# Patient Record
Sex: Female | Born: 1975 | Race: White | Hispanic: No | Marital: Married | Smoking: Never smoker
Health system: Southern US, Community
[De-identification: ages and names within clinical notes are randomized; demographics above are authoritative.]

## PROBLEM LIST (undated history)

## (undated) DIAGNOSIS — R7303 Prediabetes: Secondary | ICD-10-CM

## (undated) DIAGNOSIS — F32A Depression, unspecified: Secondary | ICD-10-CM

## (undated) DIAGNOSIS — D171 Benign lipomatous neoplasm of skin and subcutaneous tissue of trunk: Secondary | ICD-10-CM

## (undated) DIAGNOSIS — F329 Major depressive disorder, single episode, unspecified: Secondary | ICD-10-CM

## (undated) HISTORY — DX: Major depressive disorder, single episode, unspecified: F32.9

## (undated) HISTORY — DX: Benign lipomatous neoplasm of skin and subcutaneous tissue of trunk: D17.1

## (undated) HISTORY — DX: Depression, unspecified: F32.A

---

## 2001-06-05 ENCOUNTER — Other Ambulatory Visit: Admission: RE | Admit: 2001-06-05 | Discharge: 2001-06-05 | Payer: Self-pay | Admitting: *Deleted

## 2003-05-01 ENCOUNTER — Ambulatory Visit (HOSPITAL_COMMUNITY): Admission: RE | Admit: 2003-05-01 | Discharge: 2003-05-01 | Payer: Self-pay | Admitting: *Deleted

## 2003-05-01 ENCOUNTER — Encounter: Payer: Self-pay | Admitting: *Deleted

## 2003-09-21 ENCOUNTER — Inpatient Hospital Stay (HOSPITAL_COMMUNITY): Admission: AD | Admit: 2003-09-21 | Discharge: 2003-09-22 | Payer: Self-pay | Admitting: *Deleted

## 2005-10-13 ENCOUNTER — Emergency Department (HOSPITAL_COMMUNITY): Admission: EM | Admit: 2005-10-13 | Discharge: 2005-10-13 | Payer: Self-pay | Admitting: Emergency Medicine

## 2010-01-10 ENCOUNTER — Ambulatory Visit (HOSPITAL_COMMUNITY): Admission: RE | Admit: 2010-01-10 | Discharge: 2010-01-10 | Payer: Self-pay | Admitting: Family Medicine

## 2011-03-10 NOTE — H&P (Signed)
NAME:  Jane Guzman, Jane Guzman                     ACCOUNT NO.:  [id number]   MEDICAL RECORD NO.:  [id number]                   PATIENT TYPE:  INP   LOCATION:  LDR1                                 FACILITY:  [facility]   PHYSICIAN:  Lazaro Arms, M.D.                DATE OF BIRTH:  1976/03/17   DATE OF ADMISSION:  09/21/2003  DATE OF DISCHARGE:                                HISTORY & PHYSICAL   HISTORY OF PRESENT ILLNESS:  The patient is a 35 year old, gravida 3, para  2, abortus 0, with estimated date of delivery of October 08, 2003, by last  menstrual period and a second trimester sonogram.  The patient has been  followed in prenatal care by Dr. Lisette Grinder and has had full in law prenatal  care since the first trimester.  The patient presented to labor and delivery  complaining of regular uterine contractions.  She was 4 cm, 100% effaced,  with bulging bag of water and having regular contractions.  The patient did  not want to have an epidural with this delivery.   PAST MEDICAL HISTORY:  Negative.   PAST SURGICAL HISTORY:  Negative.   PAST OBSTETRICAL HISTORY:  Two vaginal deliveries.   ALLERGIES:  PENICILLIN.   MEDICATIONS:  Prenatal vitamins.   REVIEW OF SYSTEMS:  Otherwise negative.   PRENATAL LABORATORY DATA:  Blood type is O positive.  Antibody is negative.  HIV is nonreactive.  Hepatitis B was negative.  Rubella was negative.  Glucola was normal with last pregnancy.  Her AFP test was abnormal.  She had  an elevated Down's syndrome risk, and her ultrasound was  within normal  limits.  She declined an amniocentesis.  GC and Chlamydia were normal.  Drug  screen was negative.  Group B strep was negative.   PHYSICAL EXAMINATION:  Per prenatal chart, normal.   IMPRESSION:  1. Intrauterine pregnancy at 37-1/[redacted] weeks gestation.  2. Active labor.   PLAN:  The patient is admitted for expectant management.     ___________________________________________              Lazaro Arms, M.D.   LHE/MEDQ  D:  09/21/2003  T:  09/21/2003  Job:  [id number]

## 2011-03-10 NOTE — Discharge Summary (Signed)
NAME:  Jane Guzman, Jane Guzman                     ACCOUNT NO.:  [id number]   MEDICAL RECORD NO.:  [id number]                   PATIENT TYPE:  INP   LOCATION:  A419                                 FACILITY:  [facility]   PHYSICIAN:  Langley Gauss, M.D.                DATE OF BIRTH:  1976/10/13   DATE OF ADMISSION:  09/21/2003  DATE OF DISCHARGE:  09/22/2003                                 DISCHARGE SUMMARY   DIAGNOSIS:  Term pregnancy presenting in labor.   The patient admitted by Dr. Duane Lope on September 21, 2003.  Labor managed  by Dr. Duane Lope and delivery performed by Dr. Duane Lope.  Dictations  for H&P and delivery per Dr. Despina Hidden not available in the chart at time of  discharge by Dr. Lisette Grinder on September 22, 2003.   PROCEDURES:  Vaginal delivery, delivered over an intact perineum.  The  patient received IV analgesics only during the course of labor.   At time of discharge the patient is bottle feeding.  She would like to  utilize oral contraceptives for birth control purposes.  She is given a  prescription for Desogen to start in two weeks time.  Discharge medications  include Tylox for pain relief.   PERTINENT LABORATORY STUDIES:  Hemoglobin and hematocrit 12.9/38.2 with a  white count of 18.2.   HOSPITAL COURSE:  The patient admitted on September 21, 2003 in active labor.  The patient progressed very rapidly to complete dilatation.  Delivery  performed by Dr. Duane Lope according to cross-coverage arrangement.  Subsequently, I saw the patient on rounds on September 21, 2003 and the  patient is discharged to home on September 22, 2003.     ___________________________________________                                         Langley Gauss, M.D.   DC/MEDQ  D:  09/22/2003  T:  09/22/2003  Job:  [id number]

## 2011-03-10 NOTE — Op Note (Signed)
NAME:  CINTIA, GLEED                     ACCOUNT NO.:  [id number]   MEDICAL RECORD NO.:  [id number]                   PATIENT TYPE:  INP   LOCATION:  LDR1                                 FACILITY:  [facility]   PHYSICIAN:  Lazaro Arms, M.D.                DATE OF BIRTH:  [birth year]   DATE OF PROCEDURE:  09/21/2003  DATE OF DISCHARGE:                                 OPERATIVE REPORT   DELIVERY NOTE:   OBSTETRICIAN:  Lazaro Arms, M.D.   DESCRIPTION OF PROCEDURE:  Jane Guzman is a 35 year old white female, gravida  3, para 2, at 37-1/[redacted] weeks gestation.  Progressed well through active phase  of labor.  The patient was found complete pushing.  With two maternal  pushing efforts, she delivered a viable female infant at 4 with Apgars  9/9, weighing 6 pounds and 4 ounces over an intact perineum.  There was a  three-vessel cord.  Cord blood and cord gas were sent.  Placenta was  delivered spontaneously and was normal and intact.  The uterus went down  nicely, and she had appropriate postpartum bleeding, total of about 200 ml  for delivery.   The patient tolerated delivery well.  She will undergo routine postpartum  care.  She is O positive and immune rubella.  She is planning to bottle  feed.      ___________________________________________                                            Lazaro Arms, M.D.   LHE/MEDQ  D:  09/21/2003  T:  09/21/2003  Job:  [id number]

## 2011-10-24 DIAGNOSIS — R7303 Prediabetes: Secondary | ICD-10-CM

## 2011-10-24 HISTORY — DX: Prediabetes: R73.03

## 2012-06-11 ENCOUNTER — Other Ambulatory Visit (HOSPITAL_COMMUNITY): Payer: Self-pay | Admitting: Family Medicine

## 2012-06-11 DIAGNOSIS — R1012 Left upper quadrant pain: Secondary | ICD-10-CM

## 2012-06-11 DIAGNOSIS — R109 Unspecified abdominal pain: Secondary | ICD-10-CM

## 2012-06-12 ENCOUNTER — Encounter (HOSPITAL_COMMUNITY): Payer: Self-pay

## 2012-06-12 ENCOUNTER — Ambulatory Visit (HOSPITAL_COMMUNITY)
Admission: RE | Admit: 2012-06-12 | Discharge: 2012-06-12 | Disposition: A | Discharge status: Home / Self Care | Payer: 59 | Source: Ambulatory Visit | Attending: Family Medicine | Admitting: Family Medicine

## 2012-06-12 DIAGNOSIS — R599 Enlarged lymph nodes, unspecified: Secondary | ICD-10-CM | POA: Insufficient documentation

## 2012-06-12 DIAGNOSIS — R1012 Left upper quadrant pain: Secondary | ICD-10-CM | POA: Insufficient documentation

## 2012-06-12 DIAGNOSIS — R109 Unspecified abdominal pain: Secondary | ICD-10-CM

## 2012-06-12 HISTORY — DX: Prediabetes: R73.03

## 2012-06-12 MED ORDER — IOHEXOL 300 MG/ML  SOLN
100.0000 mL | Freq: Once | INTRAMUSCULAR | Status: AC | PRN
  Administered 2012-06-12: 100 mL via INTRAVENOUS

## 2012-06-19 ENCOUNTER — Encounter (INDEPENDENT_AMBULATORY_CARE_PROVIDER_SITE_OTHER): Payer: Self-pay | Admitting: *Deleted

## 2012-07-08 ENCOUNTER — Encounter (INDEPENDENT_AMBULATORY_CARE_PROVIDER_SITE_OTHER): Payer: Self-pay | Admitting: Internal Medicine

## 2012-07-08 ENCOUNTER — Ambulatory Visit (INDEPENDENT_AMBULATORY_CARE_PROVIDER_SITE_OTHER): Payer: 59 | Admitting: Internal Medicine

## 2012-07-08 VITALS — BP 150/84 | HR 84 | Temp 98.3°F | Ht 68.0 in | Wt 246.5 lb

## 2012-07-08 DIAGNOSIS — D179 Benign lipomatous neoplasm, unspecified: Secondary | ICD-10-CM | POA: Insufficient documentation

## 2012-07-08 MED ORDER — HYOSCYAMINE SULFATE 0.125 MG SL SUBL: 0.1250 mg | SUBLINGUAL_TABLET | SUBLINGUAL | Status: DC | PRN

## 2012-07-08 NOTE — Patient Instructions (Addendum)
OV in 2 months. Levsin 0.125mg  sl.

## 2012-07-08 NOTE — Progress Notes (Signed)
Subjective:     Patient ID: Jane Guzman, female   DOB: 1976-03-18, 36 y.o.   MRN: [id number]  HPIReferred by [facility] with c/o left mid lateral pain.  She tells me she can feel a knot on her side. She underwent a CT (see below). She denies rt upper quadrant pain. Appetite is good. Weight loss of 3 pounds intentional. She has a BM daily. No melena or bright red rectal bleeding.  06/08/2012 NA 138, K 4.6, Chloride 104, Glucose 106, BUN 16, Creatinine 0.90, ALP 94, AST 14, ALT 16, Albumin 4.3, WBC 8.2, H and  H 13.4 and 38.8, Platelet ct 402.  CT abdomen/pelvis with CM 06/11/2012: 1. No findings to explain the patient's left upper quadrant pain.  2. Borderline enlarged porta hepatis lymph node may relate to  hepatic steatosis.   Review of Systems see hpi Current Outpatient Prescriptions  Medication Sig Dispense Refill  . metFORMIN (GLUCOPHAGE) 500 MG tablet Take 500 mg by mouth daily with breakfast.       Past Medical History  Diagnosis Date  . Pre-diabetes 2013   Past Medical History  Diagnosis Date  . Pre-diabetes 2013   History reviewed. No pertinent past surgical history. History   Social History  . Marital Status: Married    Spouse Name: N/A    Number of Children: N/A  . Years of Education: N/A   Occupational History  . Not on file.   Social History Main Topics  . Smoking status: Never Smoker   . Smokeless tobacco: Not on file  . Alcohol Use: No  . Drug Use: No  . Sexually Active: Not on file   Other Topics Concern  . Not on file   Social History Narrative  . No narrative on file   Allergies  Allergen Reactions  . Penicillins          Objective:   Physical Exam Filed Vitals:   07/08/12 1534  BP: 150/84  Pulse: 84  Temp: 98.3 F (36.8 C)  Height: 5\' 8"  (1.727 m)  Weight: 246 lb 8 oz (111.812 kg)   Alert and oriented. Skin warm and dry. Oral mucosa is moist.   . Sclera anicteric, conjunctivae is pink. Thyroid not enlarged. No cervical  lymphadenopathy. Lungs clear. Heart regular rate and rhythm.  Abdomen is soft. Bowel sounds are positive. No hepatomegaly. ?Fatty tumor left mid abdomen. Dr. Karilyn Cota in and he also felt this area.  No tenderness.  No edema to lower extremities. Patient is alert and oriented.     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 Dr. Karilyn Cota in room during exam. Agree that this is probably a fatty tumor.     Plan:    Levsin 0.125mg  sl. # 60. OV in 2 months.

## 2012-08-02 ENCOUNTER — Encounter (INDEPENDENT_AMBULATORY_CARE_PROVIDER_SITE_OTHER): Payer: Self-pay

## 2012-09-09 ENCOUNTER — Ambulatory Visit (INDEPENDENT_AMBULATORY_CARE_PROVIDER_SITE_OTHER): Payer: 59 | Admitting: Internal Medicine

## 2012-09-17 ENCOUNTER — Encounter (INDEPENDENT_AMBULATORY_CARE_PROVIDER_SITE_OTHER): Payer: Self-pay | Admitting: Internal Medicine

## 2012-09-17 ENCOUNTER — Ambulatory Visit (INDEPENDENT_AMBULATORY_CARE_PROVIDER_SITE_OTHER): Payer: 59 | Admitting: Internal Medicine

## 2012-09-17 VITALS — BP 140/70 | HR 60 | Temp 97.8°F | Ht 68.0 in | Wt 247.7 lb

## 2012-09-17 DIAGNOSIS — D179 Benign lipomatous neoplasm, unspecified: Secondary | ICD-10-CM

## 2012-09-17 DIAGNOSIS — E119 Type 2 diabetes mellitus without complications: Secondary | ICD-10-CM | POA: Insufficient documentation

## 2012-09-17 NOTE — Progress Notes (Signed)
Subjective:     Patient ID: Jane Guzman, female   DOB: [birth year], 36 y.o.   MRN: [id number]  HPIHere today for f/u. She states she feels better.  She occasionally has left flank pain. She will take a Levsin and the pain is relieved. There is no rt upper quadrant pain. Her heart burn is better. She may have acid reflux 3-4 times a week. She eats what she wants. She has a BM about every 3-4 days since starting the Levsin. She takes the Levsin about twice a week. She feels 75% better.    06/08/2012 NA 138, K 4.6, Chloride 104, Glucose 106, BUN 16, Creatinine 0.90, ALP 94, AST 14, ALT 16, Albumin 4.3, WBC 8.2, H and H 13.4 and 38.8, Platelet ct 402.  CT abdomen/pelvis with CM 06/11/2012: 1. No findings to explain the patient's left upper quadrant pain.  2. Borderline enlarged porta hepatis lymph node may relate to hepatic steatosis.   Review of Systems See hpi Current Outpatient Prescriptions  Medication Sig Dispense Refill  . hyoscyamine (LEVSIN/SL) 0.125 MG SL tablet Place 1 tablet (0.125 mg total) under the tongue every 4 (four) hours as needed for cramping.  60 tablet  0  . metFORMIN (GLUCOPHAGE) 500 MG tablet Take 500 mg by mouth daily with breakfast.       Past Medical History  Diagnosis Date  . Pre-diabetes 2013   History reviewed. No pertinent past surgical history. Allergies  Allergen Reactions  . Penicillins        Objective:   Physical Exam Filed Vitals:   09/17/12 1543  BP: 140/70  Pulse: 60  Temp: 97.8 F (36.6 C)  Height: 5\' 8"  (1.727 m)  Weight: 247 lb 11.2 oz (112.356 kg)   Alert and oriented. Skin warm and dry. Oral mucosa is moist.   . Sclera anicteric, conjunctivae is pink. Thyroid not enlarged. No cervical lymphadenopathy. Lungs clear. Heart regular rate and rhythm.  Abdomen is soft. Bowel sounds are positive. No hepatomegaly. Enlarged lymph noted noted left lateral abdomen (flank). Soft to the touch.   No tenderness.  No edema to lower extremities.         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 Dr. Karilyn Cota previously examined patient and was in agreement.    Plan:    Continue the Levsin. OV 6 months. Any problems call our office.

## 2012-09-17 NOTE — Patient Instructions (Signed)
Continue   Levsin as needed.  OV 6 months.

## 2012-12-07 ENCOUNTER — Other Ambulatory Visit: Payer: Self-pay

## 2013-02-27 ENCOUNTER — Encounter (INDEPENDENT_AMBULATORY_CARE_PROVIDER_SITE_OTHER): Payer: Self-pay | Admitting: *Deleted

## 2013-03-19 ENCOUNTER — Encounter (INDEPENDENT_AMBULATORY_CARE_PROVIDER_SITE_OTHER): Payer: Self-pay | Admitting: Internal Medicine

## 2013-03-19 ENCOUNTER — Ambulatory Visit (INDEPENDENT_AMBULATORY_CARE_PROVIDER_SITE_OTHER): Payer: 59 | Admitting: Internal Medicine

## 2013-03-19 VITALS — BP 126/70 | HR 72 | Ht 68.0 in | Wt 255.8 lb

## 2013-03-19 DIAGNOSIS — D179 Benign lipomatous neoplasm, unspecified: Secondary | ICD-10-CM

## 2013-03-19 NOTE — Patient Instructions (Addendum)
OV as needed 

## 2013-03-19 NOTE — Progress Notes (Signed)
Subjective:     Patient ID: Jane Guzman, female   DOB: 03-Oct-1976, 37 y.o.   MRN: [id number]  HPI Here today for f/u. She says she feels okay. She occasionally has pain left later upper abdomen. She tells me she fells 90% better. Appetite good.  She has gained 10 pounds since her last vis.t BMs are normal. No melena or bright red rectal bleeding.  CMP  No results found for this basename: na, k, cl, co2, glucose, bun, creatinine, calcium, prot, albumin, ast, alt, alkphos, bilitot, gfrnonaa, gfraa       06/08/2012 NA 138, K 4.6, Chloride 104, Glucose 106, BUN 16, Creatinine 0.90, ALP 94, AST 14, ALT 16, Albumin 4.3, WBC 8.2, H and H 13.4 and 38.8, Platelet ct 402.  CT abdomen/pelvis with CM 06/11/2012: 1. No findings to explain the patient's left upper quadrant pain.  2. Borderline enlarged porta hepatis lymph node may relate to  hepatic steatosis.  Review of Systems Current Outpatient Prescriptions  Medication Sig Dispense Refill  . ranitidine (ZANTAC) 150 MG tablet Take 150 mg by mouth 2 (two) times daily.       No current facility-administered medications for this visit.   Past Medical History  Diagnosis Date  . Pre-diabetes 2013   History reviewed. No pertinent past surgical history. Allergies  Allergen Reactions  . Penicillins         Objective:   Physical Exam  Filed Vitals:   03/19/13 1626  BP: 126/70  Pulse: 72  Height: 5\' 8"  (1.727 m)  Weight: 255 lb 12.8 oz (116.03 kg)   Alert and oriented. Skin warm and dry. Oral mucosa is moist.   . Sclera anicteric, conjunctivae is pink. Thyroid not enlarged. No cervical lymphadenopathy. Lungs clear. Heart regular rate and rhythm.  Abdomen is soft. Bowel sounds are positive. No hepatomegaly. Small fatty masses left abdomen felt. No tenderness.  No edema to lower extremities.      Assessment:    fatty tumor of abdomen. I discussed this case with Dr. Karilyn Cota on her initial visit. Fatty tumor felt to be benign.        Plan:     OV prn. If any problems, please call our office.

## 2013-08-28 ENCOUNTER — Other Ambulatory Visit: Payer: Self-pay

## 2014-03-06 ENCOUNTER — Encounter: Payer: Self-pay | Admitting: Cardiology

## 2014-03-06 ENCOUNTER — Ambulatory Visit (INDEPENDENT_AMBULATORY_CARE_PROVIDER_SITE_OTHER): Payer: Private Health Insurance - Indemnity | Admitting: Cardiology

## 2014-03-06 VITALS — BP 138/66 | HR 84 | Ht 68.0 in | Wt 264.0 lb

## 2014-03-06 DIAGNOSIS — R011 Cardiac murmur, unspecified: Secondary | ICD-10-CM

## 2014-03-06 DIAGNOSIS — R0602 Shortness of breath: Secondary | ICD-10-CM | POA: Insufficient documentation

## 2014-03-06 DIAGNOSIS — R7982 Elevated C-reactive protein (CRP): Secondary | ICD-10-CM | POA: Insufficient documentation

## 2014-03-06 NOTE — Patient Instructions (Signed)
Your physician recommends that you schedule a follow-up appointment in: to be determined,we will call you with test results    Your physician has requested that you have an echocardiogram. Echocardiography is a painless test that uses sound waves to create images of your heart. It provides your doctor with information about the size and shape of your heart and how well your heart's chambers and valves are working. This procedure takes approximately one hour. There are no restrictions for this procedure.    Your physician has requested that you have an exercise tolerance test. For further information please visit [email or web address]. Please also follow instruction sheet, as given.

## 2014-03-06 NOTE — Assessment & Plan Note (Signed)
Basal systolic murmur, possibly benign. She has never had an echocardiogram however, and this will be arranged.

## 2014-03-06 NOTE — Progress Notes (Signed)
  Clinical Summary Jane Guzman is a 38 y.o.female referred for cardiology consultation by Dr. Golding. She is here with her husband. She is referred secondary to findings on screening blood work of a mildly elevated CRP, generally in an intermediate range, reportedly elevated since initial discovery back in November.  Lab work from April reviewed finding ESR 17, CK 70, ANA negative, vitamin B12 692, CRP 1.6, WBC 11.4, hemoglobin 12.6, platelets 337, hemoglobin A1c 5.6.  She reports a previously documented history of hypertension and was on treatment for a period time, although not in the last several years. Also prediabetes, not on any specific medications now. She has a family history of premature CAD in her father. In terms of symptoms, she does report shortness of breath when she walks, has been trying to do up to 2 miles at a time for weight loss. No exertional chest pain however.  ECG today shows sinus rhythm with RSR prime in lead V1, otherwise normal.  Today we discussed CRP - an acute phase reactant, its relationship to inflammation, potential etiologies, also association with increased cardiovascular events in some populations. As a stand-alone screening tool however, it is of much less use generally speaking. She does tell me that she had lipids obtained and that they were elevated, although I do not have the details.   Allergies  Allergen Reactions  . Penicillins     Current Outpatient Prescriptions  Medication Sig Dispense Refill  . B Complex-C (B-COMPLEX WITH VITAMIN C) tablet Take 1 tablet by mouth daily.      . FLUoxetine (PROZAC) 20 MG tablet Take 20 mg by mouth daily.       . Vitamin D, Ergocalciferol, (DRISDOL) 50000 UNITS CAPS capsule Take 50,000 Units by mouth every 7 (seven) days.        No current facility-administered medications for this visit.    Past Medical History  Diagnosis Date  . Pre-diabetes 2013  . Depression   . Lipoma of abdominal wall      History reviewed. No pertinent past surgical history.  Family History  Problem Relation Age of Onset  . Diabetes Mellitus II Father   . CAD Father     Premature disease    Social History Jane Guzman reports that she has never smoked. She does not have any smokeless tobacco history on file. Jane Guzman reports that she does not drink alcohol.  Review of Systems Negative except as outlined above.  Physical Examination Filed Vitals:   03/06/14 1434  BP: 138/66  Pulse: 84   Filed Weights   03/06/14 1434  Weight: 264 lb (119.75 kg)   Obese woman, appears comfortable at rest. HEENT: Conjunctiva and lids normal, oropharynx clear. Neck: Supple, no elevated JVP or carotid bruits, no thyromegaly. Lungs: Clear to auscultation, nonlabored breathing at rest. Cardiac: Regular rate and rhythm, no S3, 2/6 systolic murmur, no pericardial rub. Abdomen: Soft, nontender, bowel sounds present. Extremities: No pitting edema, distal pulses 2+. Skin: Warm and dry. Musculoskeletal: No kyphosis. Neuropsychiatric: Alert and oriented x3, affect grossly appropriate.   Problem List and Plan   CRP elevated Not particularly useful as a stand-alone screening tool, however coupled with other potential cardiac risk factors, suggest that she should at least have some baseline risk stratification in light of her shortness of breath. We do plan to get a GXT. The bigger issue however is making sure that she has regular followup with her primary care provider for risk factor modification. She may need medical   therapy for elevated blood pressure, glucose control, and lipids depending on followup over time. The elevated CRP may suggest a more aggressive approach to lipid management. We will call her with the results of her GXT.  Heart murmur Basal systolic murmur, possibly benign. She has never had an echocardiogram however, and this will be arranged.    Satira Sark, M.D., F.A.C.C.

## 2014-03-06 NOTE — Assessment & Plan Note (Signed)
Not particularly useful as a stand-alone screening tool, however coupled with other potential cardiac risk factors, suggest that she should at least have some baseline risk stratification in light of her shortness of breath. We do plan to get a GXT. The bigger issue however is making sure that she has regular followup with her primary care provider for risk factor modification. She may need medical therapy for elevated blood pressure, glucose control, and lipids depending on followup over time. The elevated CRP may suggest a more aggressive approach to lipid management. We will call her with the results of her GXT.

## 2014-03-10 ENCOUNTER — Encounter: Payer: Self-pay | Admitting: Cardiology

## 2014-03-13 ENCOUNTER — Encounter (HOSPITAL_COMMUNITY): Payer: Self-pay

## 2014-03-13 ENCOUNTER — Ambulatory Visit (HOSPITAL_COMMUNITY)
Admission: RE | Admit: 2014-03-13 | Discharge: 2014-03-13 | Disposition: A | Discharge status: Home / Self Care | Payer: Private Health Insurance - Indemnity | Source: Ambulatory Visit | Attending: Cardiovascular Disease | Admitting: Cardiovascular Disease

## 2014-03-13 ENCOUNTER — Ambulatory Visit (HOSPITAL_COMMUNITY)
Admission: RE | Admit: 2014-03-13 | Discharge: 2014-03-13 | Disposition: A | Discharge status: Home / Self Care | Payer: Private Health Insurance - Indemnity | Source: Ambulatory Visit | Attending: Cardiology | Admitting: Cardiology

## 2014-03-13 DIAGNOSIS — R0602 Shortness of breath: Secondary | ICD-10-CM | POA: Insufficient documentation

## 2014-03-13 DIAGNOSIS — R0609 Other forms of dyspnea: Secondary | ICD-10-CM | POA: Insufficient documentation

## 2014-03-13 DIAGNOSIS — R0989 Other specified symptoms and signs involving the circulatory and respiratory systems: Principal | ICD-10-CM | POA: Insufficient documentation

## 2014-03-13 DIAGNOSIS — R011 Cardiac murmur, unspecified: Secondary | ICD-10-CM

## 2014-03-13 NOTE — Progress Notes (Signed)
Stress Lab Nurses Notes - [facility]  SOLARIS KRAM 03/13/2014 Reason for doing test: Dyspnea Type of test: Regular GTX Nurse performing test: Carvel Getting, RN Nuclear Medicine Tech: Not Applicable Echo Tech: Not Applicable MD performing test: Woodroe Chen NP Family MD: Hilma Favors Test explained and consent signed: yes IV started: No IV started Symptoms: SOB and Dizzy Treatment/Intervention: None Reason test stopped: dizziness, reached target HR and SOB After recovery IV was: no IV started Patient to return to [place]. Med at : Patient discharged: Home Patient's Condition upon discharge was: stable Comments: Patient walked 6 min 47 seconds achieving A peak mets of 9.40. Exercise HR was 94 and BP 155/90 and recovery HR 171 and BP 216/63. Symptoms resolved in recovery. [name]

## 2014-03-13 NOTE — Progress Notes (Signed)
*  PRELIMINARY RESULTS* Echocardiogram 2D Echocardiogram has been performed.  Jane Guzman 03/13/2014, 9:56 AM

## 2014-03-13 NOTE — Progress Notes (Signed)
Jane Guzman  03/13/2014  Reason for doing test: Dyspnea  Type of test: Regular GTX  Nurse performing test: Carvel Getting, RN  Nuclear Medicine Tech: Not Applicable  Echo Tech: Not Applicable  MD performing test: Woodroe Chen NP  Family MD: Hilma Favors  Test explained and consent signed: yes  IV started: No IV started  Symptoms: SOB and Dizzy  Treatment/Intervention: None  Reason test stopped: dizziness, reached target HR and SOB  After recovery IV was: no IV started  Patient to return to [place]. Med at :  Patient discharged: Home  Patient's Condition upon discharge was: stable  Comments: Patient walked 6 min 47 seconds achieving A peak mets of 9.40. Exercise HR was 94 and BP 155/90 and recovery HR 171 and BP 216/63. Symptoms resolved in recovery.  [name]   ATTENDING ADDENDUM: Resting ECG demonstrated normal sinus rhythm. With exercise, there were no diagnostic ST-T abnormalities, nor any arrhythmias. This represents a Duke treadmill score of 7, indicating a low risk for major adverse cardiac events.

## 2014-03-18 ENCOUNTER — Telehealth: Payer: Self-pay | Admitting: *Deleted

## 2014-03-18 NOTE — Telephone Encounter (Signed)
Spoke with pt,given results of both echo and gxt results

## 2014-03-18 NOTE — Telephone Encounter (Signed)
GXT was read by Dr. Bronson Ing. His interpretation follows:  ATTENDING ADDENDUM:  Resting ECG demonstrated normal sinus rhythm. With exercise, there were no diagnostic ST-T abnormalities, nor any arrhythmias. This represents a Duke treadmill score of 7, indicating a low risk for major adverse cardiac events.  Please let her know that this is a low risk study and that no further cardiac testing is clearly indicated at this point.

## 2014-03-18 NOTE — Telephone Encounter (Signed)
PT would like lab results done friday

## 2014-03-18 NOTE — Telephone Encounter (Signed)
Pt calling for gxt results done on 5/22

## 2020-11-01 ENCOUNTER — Ambulatory Visit
Admission: EM | Admit: 2020-11-01 | Discharge: 2020-11-01 | Disposition: A | Discharge status: Home / Self Care | Payer: Managed Care, Other (non HMO)

## 2020-11-01 DIAGNOSIS — J029 Acute pharyngitis, unspecified: Secondary | ICD-10-CM

## 2020-11-01 DIAGNOSIS — J069 Acute upper respiratory infection, unspecified: Secondary | ICD-10-CM | POA: Diagnosis not present

## 2020-11-01 DIAGNOSIS — Z1152 Encounter for screening for COVID-19: Secondary | ICD-10-CM

## 2020-11-01 MED ORDER — CETIRIZINE HCL 10 MG PO TABS: 10.0000 mg | ORAL_TABLET | Freq: Every day | ORAL | 0 refills | Status: DC

## 2020-11-01 MED ORDER — PREDNISONE 10 MG PO TABS: 20.0000 mg | ORAL_TABLET | Freq: Every day | ORAL | 0 refills | Status: DC

## 2020-11-01 MED ORDER — FLUTICASONE PROPIONATE 50 MCG/ACT NA SUSP: 1.0000 | Freq: Every day | NASAL | 0 refills | Status: DC

## 2020-11-01 MED ORDER — BENZONATATE 100 MG PO CAPS: 100.0000 mg | ORAL_CAPSULE | Freq: Three times a day (TID) | ORAL | 0 refills | Status: DC | PRN

## 2020-11-01 NOTE — ED Provider Notes (Addendum)
[place]   [id number] 11/01/20 Arrival Time: 8250   CC: COVID symptoms  SUBJECTIVE: History from: patient.  Jane Guzman is a 45 y.o. female who presented to the urgent care for complaint of sore throat, cough, nasal congestion and bilateral ear pain for the past week.  Denies sick exposure to COVID, flu or strep.  Denies recent travel.  Has tried OTC Mucinex and allergy meds without relief.  Denies alleviating or aggravating factors.  Denies previous symptoms in the past.   Denies fever, chills, fatigue, sinus pain, rhinorrhea, sore throat, SOB, wheezing, chest pain, nausea, changes in bowel or bladder habits.     ROS: As per HPI.  All other pertinent ROS negative.      Past Medical History:  Diagnosis Date  . Depression   . Lipoma of abdominal wall   . Pre-diabetes 2013   History reviewed. No pertinent surgical history. Allergies  Allergen Reactions  . Penicillins    No current facility-administered medications on file prior to encounter.   Current Outpatient Medications on File Prior to Encounter  Medication Sig Dispense Refill  . ALPRAZolam (XANAX) 0.25 MG tablet Take 0.25 mg by mouth daily as needed.    Marland Kitchen atorvastatin (LIPITOR) 10 MG tablet Take 10 mg by mouth at bedtime.    . B Complex-C (B-COMPLEX WITH VITAMIN C) tablet Take 1 tablet by mouth daily.    Marland Kitchen FLUoxetine (PROZAC) 20 MG tablet Take 20 mg by mouth daily.     Marland Kitchen lisinopril (ZESTRIL) 10 MG tablet Take 10 mg by mouth daily.    Marland Kitchen OZEMPIC, 0.25 OR 0.5 MG/DOSE, 2 MG/1.5ML SOPN Inject into the skin.    . Vitamin D, Ergocalciferol, (DRISDOL) 50000 UNITS CAPS capsule Take 50,000 Units by mouth every 7 (seven) days.      Social History   Socioeconomic History  . Marital status: Married    Spouse name: Not on file  . Number of children: Not on file  . Years of education: Not on file  . Highest education level: Not on file  Occupational History  . Not on file  Tobacco Use  . Smoking status:  Never Smoker  . Smokeless tobacco: Never Used  Substance and Sexual Activity  . Alcohol use: No  . Drug use: No  . Sexual activity: Not on file  Other Topics Concern  . Not on file  Social History Narrative  . Not on file   Social Determinants of Health   Financial Resource Strain: Not on file  Food Insecurity: Not on file  Transportation Needs: Not on file  Physical Activity: Not on file  Stress: Not on file  Social Connections: Not on file  Intimate Partner Violence: Not on file   Family History  Problem Relation Age of Onset  . Diabetes Mellitus II Father   . CAD Father        Premature disease    OBJECTIVE:  Vitals:   11/01/20 1540 11/01/20 1544  BP:  125/80  Pulse:  74  Resp:  16  Temp:  98.7 F (37.1 C)  TempSrc:  Oral  SpO2:  98%  Weight: 270 lb (122.5 kg)      General appearance: alert; appears fatigued, but nontoxic; speaking in full sentences and tolerating own secretions HEENT: NCAT; Ears: EACs clear, TMs pearly gray; Eyes: PERRL.  EOM grossly intact. Sinuses: nontender; Nose: nares patent without rhinorrhea, Throat: oropharynx clear, tonsils non erythematous or enlarged, uvula midline  Neck: supple without LAD  Lungs: unlabored respirations, symmetrical air entry; cough: moderate; no respiratory distress; CTAB Heart: regular rate and rhythm.  Radial pulses 2+ symmetrical bilaterally Skin: warm and dry Psychological: alert and cooperative; normal mood and affect  LABS:  No results found for this or any previous visit (from the past 24 hour(s)).   ASSESSMENT & PLAN:  1. Encounter for screening for COVID-19   2. URI with cough and congestion   3. Sore throat     Meds ordered this encounter  Medications  . cetirizine (ZYRTEC ALLERGY) 10 MG tablet    Sig: Take 1 tablet (10 mg total) by mouth daily.    Dispense:  30 tablet    Refill:  0  . fluticasone (FLONASE) 50 MCG/ACT nasal spray    Sig: Place 1 spray into both nostrils daily for 14 days.     Dispense:  16 g    Refill:  0  . predniSONE (DELTASONE) 10 MG tablet    Sig: Take 2 tablets (20 mg total) by mouth daily.    Dispense:  15 tablet    Refill:  0  . benzonatate (TESSALON) 100 MG capsule    Sig: Take 1 capsule (100 mg total) by mouth 3 (three) times daily as needed for cough.    Dispense:  30 capsule    Refill:  0    Discharge instructions  COVID testing ordered.  It will take between 2-7 days for test results.  Someone will contact you regarding abnormal results.    Get plenty of rest and push fluids Tessalon Perles prescribed for cough Zyrtec for nasal congestion, runny nose, and/or sore throat Flonase for nasal congestion and runny nose prednisone was prescribed Use medications daily for symptom relief Use OTC medications like ibuprofen or tylenol as needed fever or pain Call or go to the ED if you have any new or worsening symptoms such as fever, worsening cough, shortness of breath, chest tightness, chest pain, turning blue, changes in mental status, etc...   Reviewed expectations re: course of current medical issues. Questions answered. Outlined signs and symptoms indicating need for more acute intervention. Patient verbalized understanding. After Visit Summary given.         Emerson Monte, FNP 11/01/20 1609    Emerson Monte, FNP 11/01/20 1611

## 2020-11-01 NOTE — ED Triage Notes (Signed)
Patient presents to Urgent Care with complaints of sore throat x 1 week, cough, chest congestion, headaches, bilateral fullness since yesterday. Treating symptoms with mucinex and OTC allergy meds. Had a rapid covid test result was negative.   Denies fever.

## 2020-11-01 NOTE — Discharge Instructions (Addendum)
COVID testing ordered.  It will take between 2-7 days for test results.  Someone will contact you regarding abnormal results.    Get plenty of rest and push fluids Tessalon Perles prescribed for cough Zyrtec for nasal congestion, runny nose, and/or sore throat Flonase for nasal congestion and runny nose prednisone was prescribed Use medications daily for symptom relief Use OTC medications like ibuprofen or tylenol as needed fever or pain Call or go to the ED if you have any new or worsening symptoms such as fever, worsening cough, shortness of breath, chest tightness, chest pain, turning blue, changes in mental status, etc..Marland Kitchen

## 2020-11-03 LAB — COVID-19, FLU A+B NAA
Influenza A, NAA: NOT DETECTED
Influenza B, NAA: NOT DETECTED
SARS-CoV-2, NAA: DETECTED — AB

## 2020-12-01 ENCOUNTER — Encounter (HOSPITAL_COMMUNITY): Payer: Self-pay | Admitting: Emergency Medicine

## 2020-12-01 ENCOUNTER — Other Ambulatory Visit: Payer: Self-pay

## 2020-12-01 DIAGNOSIS — Z86018 Personal history of other benign neoplasm: Secondary | ICD-10-CM | POA: Insufficient documentation

## 2020-12-01 DIAGNOSIS — Z79899 Other long term (current) drug therapy: Secondary | ICD-10-CM | POA: Diagnosis not present

## 2020-12-01 DIAGNOSIS — R1011 Right upper quadrant pain: Secondary | ICD-10-CM | POA: Insufficient documentation

## 2020-12-01 DIAGNOSIS — R112 Nausea with vomiting, unspecified: Secondary | ICD-10-CM | POA: Insufficient documentation

## 2020-12-01 NOTE — ED Triage Notes (Signed)
Pt c/o abd pain with n/v since Sunday.

## 2020-12-02 ENCOUNTER — Emergency Department (HOSPITAL_COMMUNITY)
Admission: EM | Admit: 2020-12-02 | Discharge: 2020-12-02 | Disposition: A | Discharge status: Home / Self Care | Payer: Managed Care, Other (non HMO) | Attending: Emergency Medicine | Admitting: Emergency Medicine

## 2020-12-02 ENCOUNTER — Emergency Department (HOSPITAL_COMMUNITY): Payer: Managed Care, Other (non HMO)

## 2020-12-02 DIAGNOSIS — R1011 Right upper quadrant pain: Secondary | ICD-10-CM

## 2020-12-02 LAB — CBC
HCT: 41.4 % (ref 36.0–46.0)
Hemoglobin: 13.1 g/dL (ref 12.0–15.0)
MCH: 28.7 pg (ref 26.0–34.0)
MCHC: 31.6 g/dL (ref 30.0–36.0)
MCV: 90.8 fL (ref 80.0–100.0)
Platelets: 451 10*3/uL — ABNORMAL HIGH (ref 150–400)
RBC: 4.56 MIL/uL (ref 3.87–5.11)
RDW: 14.6 % (ref 11.5–15.5)
WBC: 13.9 10*3/uL — ABNORMAL HIGH (ref 4.0–10.5)
nRBC: 0 % (ref 0.0–0.2)

## 2020-12-02 LAB — COMPREHENSIVE METABOLIC PANEL
ALT: 51 U/L — ABNORMAL HIGH (ref 0–44)
AST: 43 U/L — ABNORMAL HIGH (ref 15–41)
Albumin: 4 g/dL (ref 3.5–5.0)
Alkaline Phosphatase: 102 U/L (ref 38–126)
Anion gap: 10 (ref 5–15)
BUN: 20 mg/dL (ref 6–20)
CO2: 24 mmol/L (ref 22–32)
Calcium: 9.6 mg/dL (ref 8.9–10.3)
Chloride: 101 mmol/L (ref 98–111)
Creatinine, Ser: 0.86 mg/dL (ref 0.44–1.00)
GFR, Estimated: >60 mL/min (ref >60)
Glucose, Bld: 167 mg/dL — ABNORMAL HIGH (ref 70–99)
Potassium: 3.6 mmol/L (ref 3.5–5.1)
Sodium: 135 mmol/L (ref 135–145)
Total Bilirubin: 0.8 mg/dL (ref 0.3–1.2)
Total Protein: 7.7 g/dL (ref 6.5–8.1)

## 2020-12-02 LAB — URINALYSIS, MICROSCOPIC (REFLEX)
RBC / HPF: NONE SEEN RBC/hpf (ref 0–5)
WBC, UA: NONE SEEN WBC/hpf (ref 0–5)

## 2020-12-02 LAB — URINALYSIS, ROUTINE W REFLEX MICROSCOPIC
Bilirubin Urine: NEGATIVE
Glucose, UA: NEGATIVE mg/dL
Hgb urine dipstick: NEGATIVE
Ketones, ur: NEGATIVE mg/dL
Leukocytes,Ua: NEGATIVE
Nitrite: NEGATIVE
Protein, ur: 30 mg/dL — AB
Specific Gravity, Urine: >1.03 — ABNORMAL HIGH (ref 1.005–1.030)
pH: 6 (ref 5.0–8.0)

## 2020-12-02 LAB — POC URINE PREG, ED: Preg Test, Ur: NEGATIVE

## 2020-12-02 LAB — LIPASE, BLOOD: Lipase: 23 U/L (ref 11–51)

## 2020-12-02 MED ORDER — LACTATED RINGERS IV BOLUS
1000.0000 mL | Freq: Once | INTRAVENOUS | Status: AC
  Administered 2020-12-02: 1000 mL via INTRAVENOUS

## 2020-12-02 MED ORDER — PROMETHAZINE HCL 25 MG RE SUPP: 25.0000 mg | Freq: Four times a day (QID) | RECTAL | 0 refills | Status: DC | PRN

## 2020-12-02 MED ORDER — IOHEXOL 300 MG/ML  SOLN
100.0000 mL | Freq: Once | INTRAMUSCULAR | Status: AC | PRN
  Administered 2020-12-02: 100 mL via INTRAVENOUS

## 2020-12-02 MED ORDER — METOCLOPRAMIDE HCL 5 MG/ML IJ SOLN
10.0000 mg | Freq: Once | INTRAMUSCULAR | Status: AC
  Administered 2020-12-02: 10 mg via INTRAVENOUS
  Filled 2020-12-02: qty 2

## 2020-12-02 MED ORDER — PROMETHAZINE HCL 25 MG PO TABS: 25.0000 mg | ORAL_TABLET | Freq: Four times a day (QID) | ORAL | 0 refills | Status: DC | PRN

## 2020-12-02 MED ORDER — HYDROMORPHONE HCL 1 MG/ML IJ SOLN
1.0000 mg | Freq: Once | INTRAMUSCULAR | Status: AC
  Administered 2020-12-02: 1 mg via INTRAVENOUS
  Filled 2020-12-02: qty 1

## 2020-12-02 NOTE — ED Provider Notes (Signed)
[facility] EMERGENCY DEPARTMENT Provider Note   CSN: [id number] Arrival date & time: 12/01/20  2304     History Chief Complaint  Patient presents with  . Abdominal Pain    Jane Guzman is a 45 y.o. female.  The history is provided by the patient.  Abdominal Pain Pain location:  Epigastric and RUQ Pain quality: aching and sharp   Pain severity:  Mild Onset quality:  Gradual Duration:  4 days Timing:  Intermittent Progression:  Worsening Chronicity:  New Context: not alcohol use and not diet changes   Relieved by:  None tried Worsened by:  Nothing Ineffective treatments:  None tried Associated symptoms: nausea and vomiting   Associated symptoms: no anorexia and no belching        Past Medical History:  Diagnosis Date  . Depression   . Lipoma of abdominal wall   . Pre-diabetes 2013    Patient Active Problem List   Diagnosis Date Noted  . CRP elevated 03/06/2014  . Shortness of breath 03/06/2014  . Heart murmur 03/06/2014    History reviewed. No pertinent surgical history.   OB History   No obstetric history on file.     Family History  Problem Relation Age of Onset  . Diabetes Mellitus II Father   . CAD Father        Premature disease    Social History   Tobacco Use  . Smoking status: Never Smoker  . Smokeless tobacco: Never Used  Substance Use Topics  . Alcohol use: No  . Drug use: No    Home Medications Prior to Admission medications   Medication Sig Start Date End Date Taking? Authorizing Provider  promethazine (PHENERGAN) 25 MG suppository Place 1 suppository (25 mg total) rectally every 6 (six) hours as needed for nausea or vomiting. 12/02/20  Yes Almarie Kurdziel, Corene Cornea, MD  promethazine (PHENERGAN) 25 MG tablet Take 1 tablet (25 mg total) by mouth every 6 (six) hours as needed for nausea or vomiting. 12/02/20  Yes Alyanah Elliott, Corene Cornea, MD  ALPRAZolam (XANAX) 0.25 MG tablet Take 0.25 mg by mouth daily as needed. 08/26/20   [provider]   atorvastatin (LIPITOR) 10 MG tablet Take 10 mg by mouth at bedtime. 09/30/20   [provider]  B Complex-C (B-COMPLEX WITH VITAMIN C) tablet Take 1 tablet by mouth daily.    [provider]  benzonatate (TESSALON) 100 MG capsule Take 1 capsule (100 mg total) by mouth 3 (three) times daily as needed for cough. 11/01/20   Avegno, Darrelyn Hillock, FNP  cetirizine (ZYRTEC ALLERGY) 10 MG tablet Take 1 tablet (10 mg total) by mouth daily. 11/01/20   Avegno, Darrelyn Hillock, FNP  FLUoxetine (PROZAC) 20 MG tablet Take 20 mg by mouth daily.  02/12/14   [provider]  fluticasone (FLONASE) 50 MCG/ACT nasal spray Place 1 spray into both nostrils daily for 14 days. 11/01/20 11/15/20  Avegno, Darrelyn Hillock, FNP  lisinopril (ZESTRIL) 10 MG tablet Take 10 mg by mouth daily. 10/31/20   [provider]  OZEMPIC, 0.25 OR 0.5 MG/DOSE, 2 MG/1.5ML SOPN Inject into the skin. 10/29/20   [provider]  predniSONE (DELTASONE) 10 MG tablet Take 2 tablets (20 mg total) by mouth daily. 11/01/20   Avegno, Darrelyn Hillock, FNP  Vitamin D, Ergocalciferol, (DRISDOL) 50000 UNITS CAPS capsule Take 50,000 Units by mouth every 7 (seven) days.  02/11/14   [provider]    Allergies    Penicillins  Review of Systems  Review of Systems  Gastrointestinal: Positive for abdominal pain, nausea and vomiting. Negative for anorexia.  All other systems reviewed and are negative.   Physical Exam Updated Vital Signs BP (!) 112/57   Pulse 75   Temp 98.2 F (36.8 C)   Resp 17   Ht 5' 7"  (1.702 m)   Wt 119.7 kg   LMP 11/01/2020   SpO2 100%   BMI 41.35 kg/m   Physical Exam Vitals and nursing note reviewed.  Constitutional:      Appearance: She is well-developed and well-nourished.  HENT:     Head: Normocephalic and atraumatic.     Nose: No congestion or rhinorrhea.  Eyes:     Pupils: Pupils are equal, round, and reactive to light.  Cardiovascular:     Rate and Rhythm: Normal rate and  regular rhythm.  Pulmonary:     Effort: No respiratory distress.     Breath sounds: No stridor.  Abdominal:     General: There is no distension.     Tenderness: There is abdominal tenderness in the right upper quadrant.  Musculoskeletal:        General: No swelling or tenderness. Normal range of motion.     Cervical back: Normal range of motion.  Skin:    General: Skin is warm and dry.  Neurological:     General: No focal deficit present.     Mental Status: She is alert.     ED Results / Procedures / Treatments   Labs (all labs ordered are listed, but only abnormal results are displayed) Labs Reviewed  COMPREHENSIVE METABOLIC PANEL - Abnormal; Notable for the following components:      Result Value   Glucose, Bld 167 (*)    AST 43 (*)    ALT 51 (*)    All other components within normal limits  CBC - Abnormal; Notable for the following components:   WBC 13.9 (*)    Platelets 451 (*)    All other components within normal limits  URINALYSIS, ROUTINE W REFLEX MICROSCOPIC - Abnormal; Notable for the following components:   Specific Gravity, Urine >1.030 (*)    Protein, ur 30 (*)    All other components within normal limits  URINALYSIS, MICROSCOPIC (REFLEX) - Abnormal; Notable for the following components:   Bacteria, UA RARE (*)    All other components within normal limits  LIPASE, BLOOD  POC URINE PREG, ED    EKG None  Radiology CT ABDOMEN PELVIS W CONTRAST  Result Date: 12/02/2020 CLINICAL DATA:  Abdominal pain with nausea and vomiting EXAM: CT ABDOMEN AND PELVIS WITH CONTRAST TECHNIQUE: Multidetector CT imaging of the abdomen and pelvis was performed using the standard protocol following bolus administration of intravenous contrast. CONTRAST:  122m OMNIPAQUE IOHEXOL 300 MG/ML  SOLN COMPARISON:  None. FINDINGS: Lower chest: The visualized heart size within normal limits. No pericardial fluid/thickening. No hiatal hernia. The visualized portions of the lungs are clear.  Hepatobiliary: There is diffuse low density seen throughout the liver parenchyma. No focal hepatic lesion is seen. No intra or extrahepatic biliary ductal dilatation.The main portal vein is patent. No evidence of calcified gallstones, gallbladder wall thickening or biliary dilatation. Pancreas: Mild fatty atrophy of the pancreatic head is noted. No pancreatic ductal dilatation or surrounding inflammatory changes. Spleen: Normal in size without focal abnormality. Adrenals/Urinary Tract: Both adrenal glands appear normal. The kidneys and collecting system appear normal without evidence of urinary tract calculus or hydronephrosis. Bladder is unremarkable. Stomach/Bowel: The stomach, small  bowel, and colon are normal in appearance. No inflammatory changes, wall thickening, or obstructive findings.The appendix is normal. Vascular/Lymphatic: There are no enlarged mesenteric, retroperitoneal, or pelvic lymph nodes. Mild scattered aortic atherosclerosis seen at the aorta bi-iliac bifurcation. Reproductive: The uterus and adnexa are unremarkable. Other: No evidence of abdominal wall mass or hernia. Musculoskeletal: No acute or significant osseous findings. IMPRESSION: No acute intra-abdominal or pelvic pathology to explain the patient's symptoms Hepatic steatosis Aortic Atherosclerosis (ICD10-I70.0). Electronically Signed   By: Prudencio Pair M.D.   On: 12/02/2020 02:27    Procedures Procedures   Medications Ordered in ED Medications  HYDROmorphone (DILAUDID) injection 1 mg (1 mg Intravenous Given 12/02/20 0122)  metoCLOPramide (REGLAN) injection 10 mg (10 mg Intravenous Given 12/02/20 0122)  lactated ringers bolus 1,000 mL (0 mLs Intravenous Stopped 12/02/20 0353)  iohexol (OMNIPAQUE) 300 MG/ML solution 100 mL (100 mLs Intravenous Contrast Given 12/02/20 0212)    ED Course  I have reviewed the triage vital signs and the nursing notes.  Pertinent labs & imaging results that were available during my care of the  patient were reviewed by me and considered in my medical decision making (see chart for details).    MDM Rules/Calculators/A&P                          Could be biliary colic versus peptic ulcer disease.  She has been seen by her physician for the same.  Her pain is improved here significantly set on seeing indication for admission or further work-up at this time.  Outpatient ultrasound ordered.  Patient can follow-up with her doctor pending results for further management.  Encourage return here with any new or worsening symptoms.  Final Clinical Impression(s) / ED Diagnoses Final diagnoses:  Right upper quadrant abdominal pain    Rx / DC Orders ED Discharge Orders         Ordered    promethazine (PHENERGAN) 25 MG suppository  Every 6 hours PRN        12/02/20 0335    promethazine (PHENERGAN) 25 MG tablet  Every 6 hours PRN        12/02/20 0335    US Abdomen Limited RUQ/Gall Gladder        12/02/20 0335           Rakwon Letourneau, Corene Cornea, MD 12/02/20 [phone]

## 2020-12-07 ENCOUNTER — Ambulatory Visit (HOSPITAL_COMMUNITY)
Admission: RE | Admit: 2020-12-07 | Discharge: 2020-12-07 | Disposition: A | Discharge status: Home / Self Care | Payer: Managed Care, Other (non HMO) | Source: Ambulatory Visit | Attending: Emergency Medicine | Admitting: Emergency Medicine

## 2020-12-07 ENCOUNTER — Other Ambulatory Visit: Payer: Self-pay

## 2020-12-07 DIAGNOSIS — R1011 Right upper quadrant pain: Secondary | ICD-10-CM | POA: Diagnosis present

## 2020-12-08 ENCOUNTER — Encounter (INDEPENDENT_AMBULATORY_CARE_PROVIDER_SITE_OTHER): Payer: Self-pay | Admitting: *Deleted

## 2021-03-10 ENCOUNTER — Encounter (INDEPENDENT_AMBULATORY_CARE_PROVIDER_SITE_OTHER): Payer: Self-pay | Admitting: *Deleted

## 2021-03-10 ENCOUNTER — Telehealth (INDEPENDENT_AMBULATORY_CARE_PROVIDER_SITE_OTHER): Payer: Self-pay | Admitting: *Deleted

## 2021-03-10 ENCOUNTER — Ambulatory Visit (INDEPENDENT_AMBULATORY_CARE_PROVIDER_SITE_OTHER): Payer: Managed Care, Other (non HMO) | Admitting: Gastroenterology

## 2021-03-10 NOTE — Telephone Encounter (Signed)
Noted. Thanks.

## 2021-03-10 NOTE — Telephone Encounter (Signed)
Dr. Jenetta Downer - patient was a no show to see you today for New Patient appointment.

## 2022-01-19 IMAGING — CT CT ABD-PELV W/ CM
2 of 5 series · 16 of 46 positions shown, 18 images · IV contrast (Omnipaque or Isovue)
Comparison: None.

CLINICAL DATA: Abdominal pain with nausea and vomiting

EXAM:
CT ABDOMEN AND PELVIS WITH CONTRAST
TECHNIQUE: Multidetector CT imaging of the abdomen and pelvis was performed
using the standard protocol following bolus administration of
intravenous contrast.
CONTRAST:  100mL OMNIPAQUE IOHEXOL 300 MG/ML  SOLN

[Series 2: axial st · axial · 0.67mm/px · z∈[+521,+976]mm · 13 of 103 slices shown, 15 images]
[im 6/103  soft-tissue]
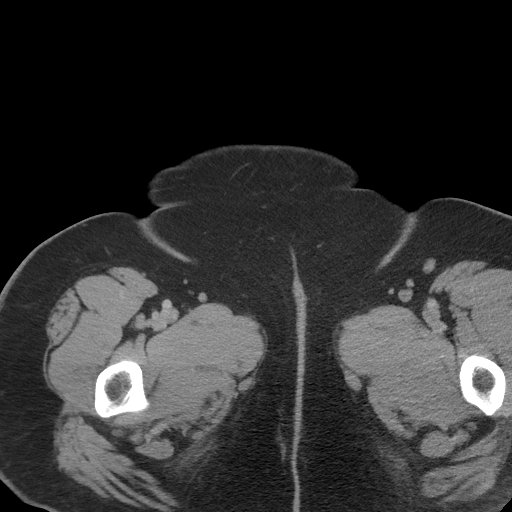
[im 6/103  bone]
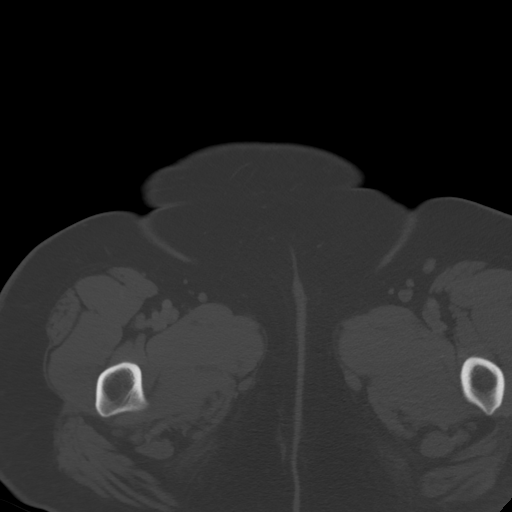
[im 12/103  soft-tissue]
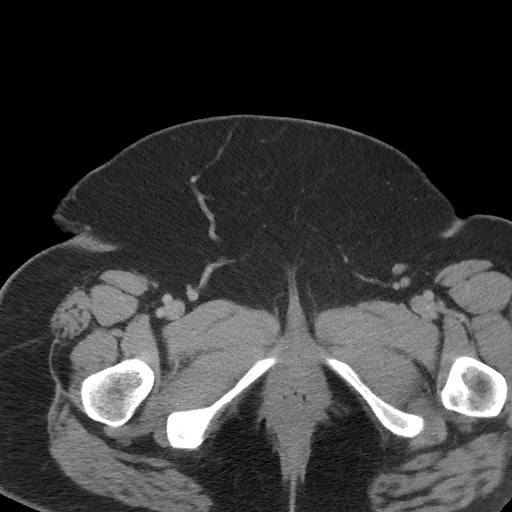
[im 23/103  soft-tissue]
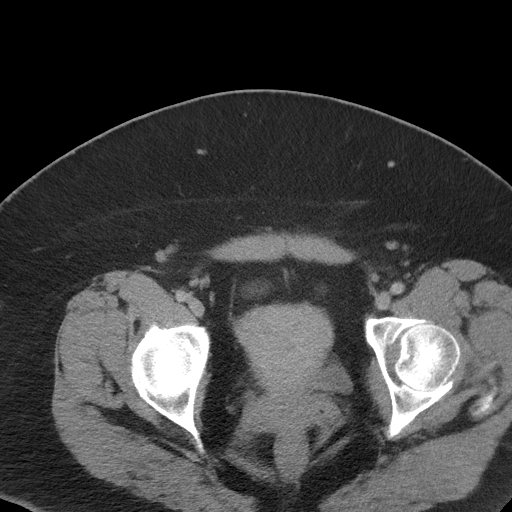
[im 29/103  soft-tissue]
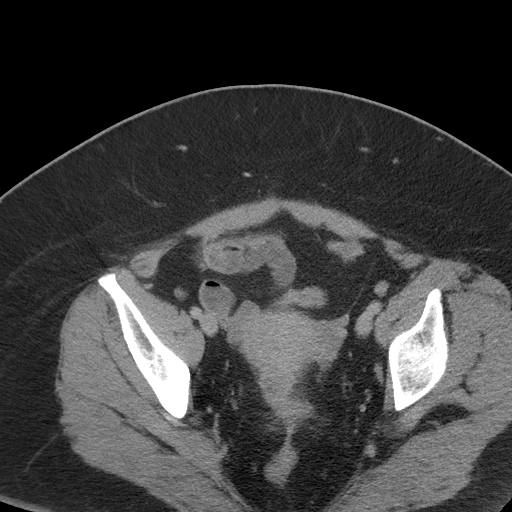
[im 35/103  soft-tissue]
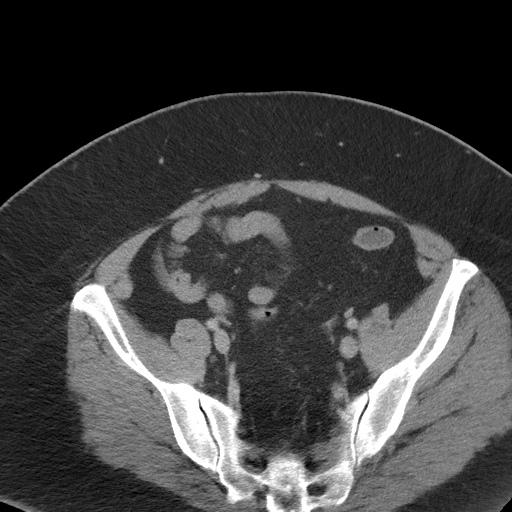
[im 46/103  soft-tissue]
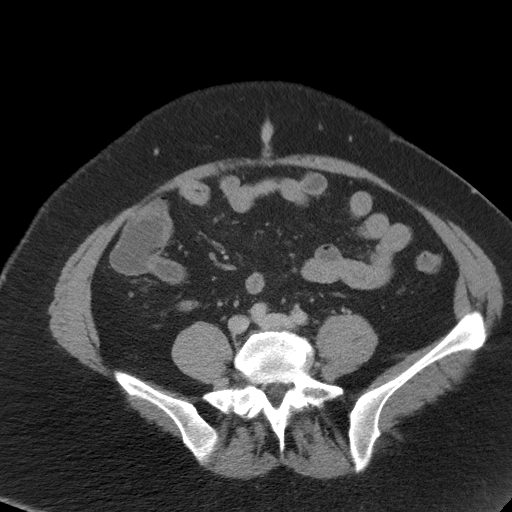
[im 52/103  soft-tissue]
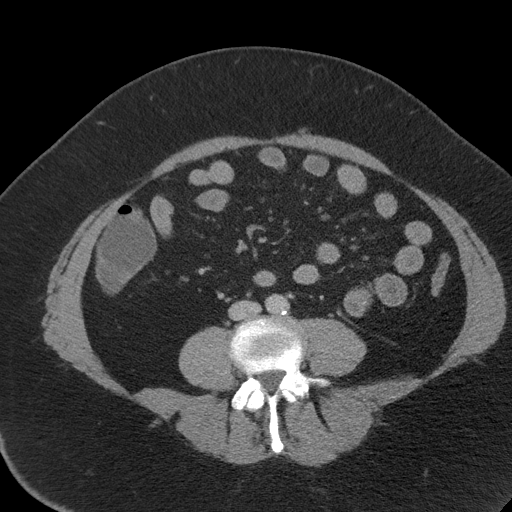
[im 57/103  soft-tissue]
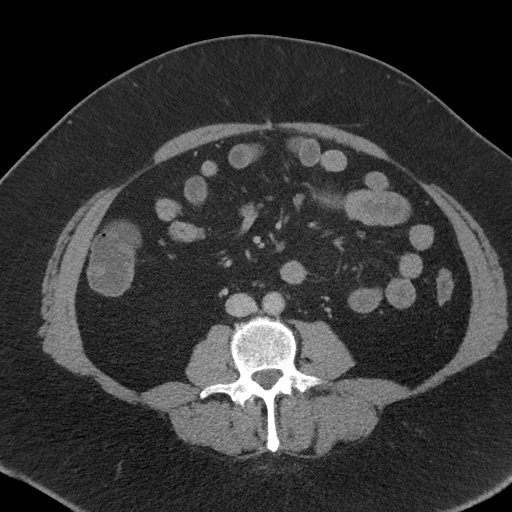
[im 69/103  soft-tissue]
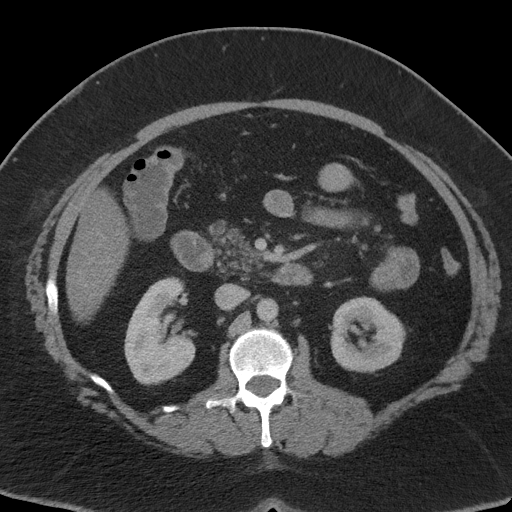
[im 69/103  bone]
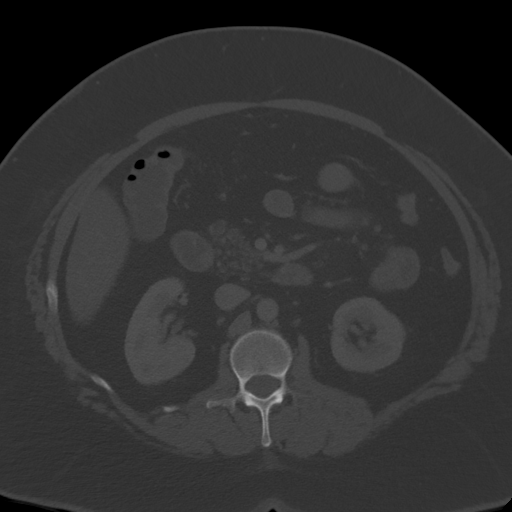
[im 74/103  soft-tissue]
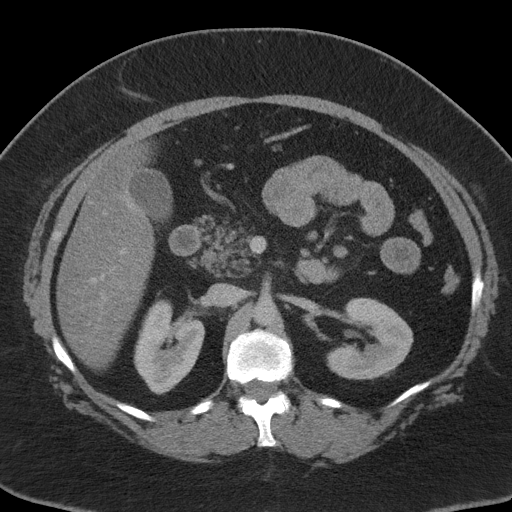
[im 80/103  soft-tissue]
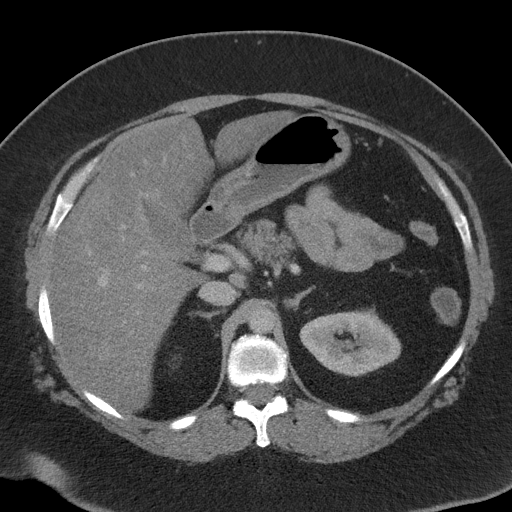
[im 91/103  soft-tissue]
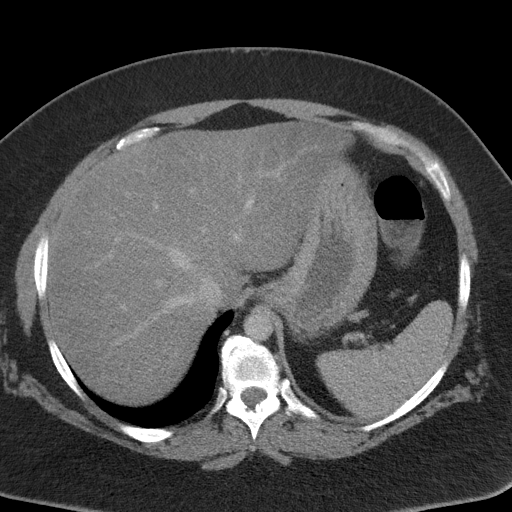
[im 97/103  soft-tissue]
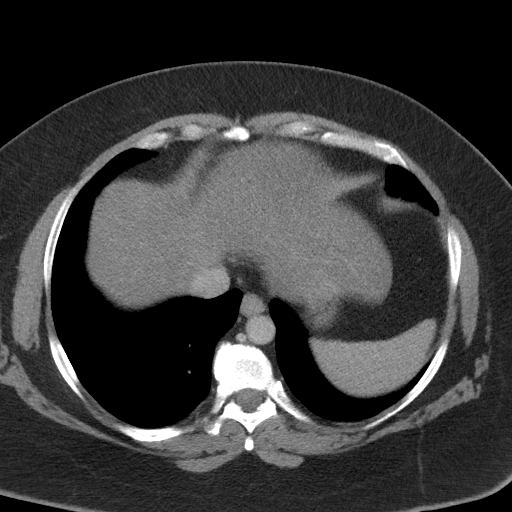

[Series 5: coronal st · coronal · 0.90mm/px · 3 of 89 slices shown]
[im 30/89  soft-tissue]
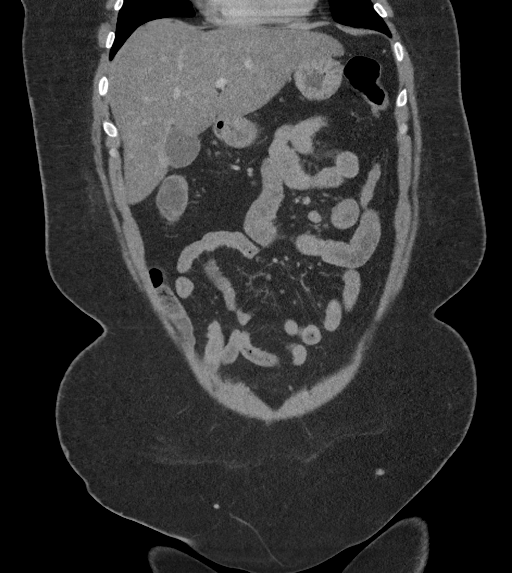
[im 40/89  soft-tissue]
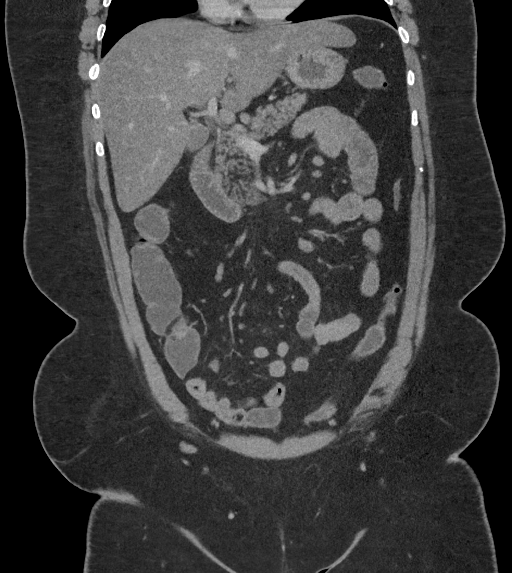
[im 49/89  soft-tissue]
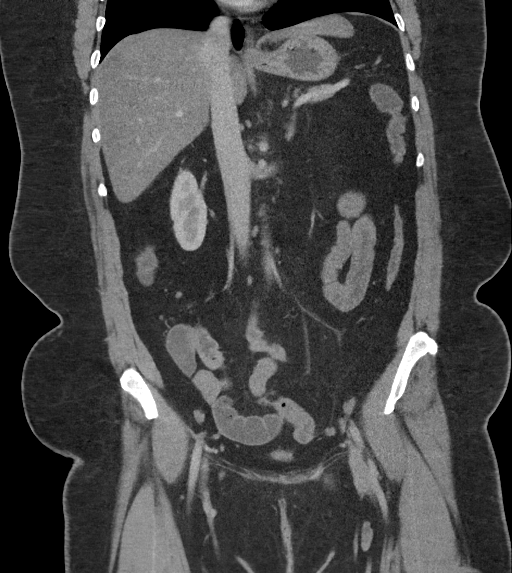

[16 of 46 positions shown; findings below may reference images not displayed]

FINDINGS: Lower chest: The visualized heart size within normal limits. No
pericardial fluid/thickening.

No hiatal hernia.

The visualized portions of the lungs are clear.

Hepatobiliary: There is diffuse low density seen throughout the
liver parenchyma. No focal hepatic lesion is seen. No intra or
extrahepatic biliary ductal dilatation.The main portal vein is
patent. No evidence of calcified gallstones, gallbladder wall
thickening or biliary dilatation.

Pancreas: Mild fatty atrophy of the pancreatic head is noted. No
pancreatic ductal dilatation or surrounding inflammatory changes.

Spleen: Normal in size without focal abnormality.

Adrenals/Urinary Tract: Both adrenal glands appear normal. The
kidneys and collecting system appear normal without evidence of
urinary tract calculus or hydronephrosis. Bladder is unremarkable.

Stomach/Bowel: The stomach, small bowel, and colon are normal in
appearance. No inflammatory changes, wall thickening, or obstructive
findings.The appendix is normal.

Vascular/Lymphatic: There are no enlarged mesenteric,
retroperitoneal, or pelvic lymph nodes. Mild scattered aortic
atherosclerosis seen at the aorta bi-iliac bifurcation.

Reproductive: The uterus and adnexa are unremarkable.

Other: No evidence of abdominal wall mass or hernia.

Musculoskeletal: No acute or significant osseous findings.
IMPRESSION: No acute intra-abdominal or pelvic pathology to explain the
patient's symptoms

Hepatic steatosis

Aortic Atherosclerosis ([id number].N).

## 2022-01-24 ENCOUNTER — Other Ambulatory Visit (HOSPITAL_COMMUNITY): Payer: Self-pay | Admitting: Internal Medicine

## 2022-01-24 DIAGNOSIS — Z1231 Encounter for screening mammogram for malignant neoplasm of breast: Secondary | ICD-10-CM

## 2022-02-13 ENCOUNTER — Encounter (HOSPITAL_COMMUNITY): Payer: Self-pay | Admitting: Radiology

## 2022-02-13 ENCOUNTER — Ambulatory Visit (HOSPITAL_COMMUNITY)
Admission: RE | Admit: 2022-02-13 | Discharge: 2022-02-13 | Disposition: A | Discharge status: Home / Self Care | Payer: Managed Care, Other (non HMO) | Source: Ambulatory Visit | Attending: Internal Medicine | Admitting: Internal Medicine

## 2022-02-13 DIAGNOSIS — Z1231 Encounter for screening mammogram for malignant neoplasm of breast: Secondary | ICD-10-CM | POA: Diagnosis present

## 2022-03-30 ENCOUNTER — Ambulatory Visit (INDEPENDENT_AMBULATORY_CARE_PROVIDER_SITE_OTHER): Payer: Managed Care, Other (non HMO) | Admitting: Obstetrics & Gynecology

## 2022-03-30 ENCOUNTER — Encounter: Payer: Self-pay | Admitting: Obstetrics & Gynecology

## 2022-03-30 ENCOUNTER — Other Ambulatory Visit (HOSPITAL_COMMUNITY)
Admission: RE | Admit: 2022-03-30 | Discharge: 2022-03-30 | Disposition: A | Discharge status: Home / Self Care | Payer: Managed Care, Other (non HMO) | Source: Ambulatory Visit | Attending: Obstetrics & Gynecology | Admitting: Obstetrics & Gynecology

## 2022-03-30 VITALS — BP 124/75 | HR 78 | Ht 67.0 in | Wt 255.0 lb

## 2022-03-30 DIAGNOSIS — N939 Abnormal uterine and vaginal bleeding, unspecified: Secondary | ICD-10-CM

## 2022-03-30 DIAGNOSIS — Z01419 Encounter for gynecological examination (general) (routine) without abnormal findings: Secondary | ICD-10-CM

## 2022-03-30 DIAGNOSIS — L309 Dermatitis, unspecified: Secondary | ICD-10-CM

## 2022-03-30 MED ORDER — MEDROXYPROGESTERONE ACETATE 10 MG PO TABS: 10.0000 mg | ORAL_TABLET | Freq: Every day | ORAL | 3 refills | Status: AC

## 2022-03-30 MED ORDER — TRIAMCINOLONE ACETONIDE 0.5 % EX OINT: 1.0000 "application " | TOPICAL_OINTMENT | Freq: Two times a day (BID) | CUTANEOUS | 1 refills | Status: AC | PRN

## 2022-03-30 NOTE — Progress Notes (Signed)
WELL-WOMAN EXAMINATION Patient name: Jane Guzman MRN [id number]  Date of birth: 02/13/1976 Chief Complaint:   Gynecologic Exam  History of Present Illness:   Jane Guzman is a 46 y.o. [id number]  female being seen today for a routine well-woman exam.  Today she notes the following concerns:  AUB:  Menses are irregular, sometimes every 1-2 mos other times every 6 mos.  It has been like this since she was 46yo.  Previously on OCPs, which did regulate her period, but she does not want to go back to this medication.  Patient's last menstrual period was 02/24/2022.  The current method of family planning is vasectomy.    Last pap not sure, at least 3-37yr ago.  Last mammogram: 01/2022- Cat. I neg. Last colonoscopy: cologuard recently completed 2023     03/30/2022    1:32 PM  Depression screen PHQ 2/9  Decreased Interest 0  Down, Depressed, Hopeless 0  PHQ - 2 Score 0  Altered sleeping 1  Tired, decreased energy 1  Change in appetite 1  Feeling bad or failure about yourself  0  Trouble concentrating 0  Moving slowly or fidgety/restless 0  Suicidal thoughts 0  PHQ-9 Score 3      Review of Systems:   Pertinent items are noted in HPI Denies any headaches, blurred vision, fatigue, shortness of breath, chest pain, abdominal pain, bowel movements, urination, or intercourse unless otherwise stated above.  Pertinent History Reviewed:  Reviewed past medical,surgical, social and family history.  Reviewed problem list, medications and allergies. Physical Assessment:   Vitals:   03/30/22 1333  BP: 124/75  Pulse: 78  Weight: 255 lb (115.7 kg)  Height: 5' 7"  (1.702 m)  Body mass index is 39.94 kg/m.        Physical Examination:   General appearance - well appearing, and in no distress  Mental status - alert, oriented to person, place, and time  Psych:  She has a normal mood and affect  Skin - warm and dry, normal color, no suspicious lesions noted  Chest - effort  normal, all lung fields clear to auscultation bilaterally  Heart - normal rate and regular rhythm  Neck:  midline trachea, no thyromegaly or nodules  Breasts - breasts appear normal, no suspicious masses, no skin or nipple changes or  axillary nodes  Abdomen - soft, nontender, nondistended, no masses or organomegaly  Pelvic - VULVA: mild erythema noted on labia minora with small fissues, no acute lesion noted  VAGINA: normal appearing vagina with normal color and discharge, no lesions  CERVIX: normal appearing cervix without discharge or lesions, no CMT  Thin prep pap is done with HR HPV cotesting  UTERUS: uterus is felt to be normal size, shape, consistency and nontender   ADNEXA: No adnexal masses or tenderness noted.  Extremities:  No swelling or varicosities noted  Chaperone: ACelene Squibb    Assessment & Plan:  1) Well-Woman Exam -pap collected reviewed screening guidelines -mammogram and colon screening up to date  2) AUB/Oligomenorrhea -suspect PCOS -discussed importance of cycle at least every 6 mos to prevent hyperplasia and potentially endometrial cancer -plan to take provera prn- typically if no menses for more than 3 mos -Questions/concerns addressed and she is agreeable to this option  3) Vulvar dermatitis -reviewed findings -low dose steroid cream sent in -f/u prn  No orders of the defined types were placed in this encounter.   Meds: No orders of the defined types were placed in  this encounter.   Follow-up: No follow-ups on file.   Janyth Pupa, DO Attending [place], [facility] for [employer], [place]

## 2022-04-04 LAB — CYTOLOGY - PAP
Comment: NEGATIVE
Diagnosis: NEGATIVE
Diagnosis: REACTIVE
High risk HPV: NEGATIVE

## 2023-07-23 ENCOUNTER — Other Ambulatory Visit (HOSPITAL_COMMUNITY): Payer: Self-pay | Admitting: Family Medicine

## 2023-07-23 DIAGNOSIS — Z1231 Encounter for screening mammogram for malignant neoplasm of breast: Secondary | ICD-10-CM

## 2023-07-26 ENCOUNTER — Inpatient Hospital Stay (HOSPITAL_COMMUNITY): Admission: RE | Admit: 2023-07-26 | Payer: Managed Care, Other (non HMO) | Source: Ambulatory Visit

## 2023-07-26 ENCOUNTER — Ambulatory Visit (HOSPITAL_COMMUNITY)
Admission: RE | Admit: 2023-07-26 | Discharge: 2023-07-26 | Disposition: A | Discharge status: Home / Self Care | Payer: Managed Care, Other (non HMO) | Source: Ambulatory Visit | Attending: Family Medicine | Admitting: Family Medicine

## 2023-07-26 ENCOUNTER — Encounter (HOSPITAL_COMMUNITY): Payer: Self-pay

## 2023-07-26 DIAGNOSIS — Z1231 Encounter for screening mammogram for malignant neoplasm of breast: Secondary | ICD-10-CM | POA: Insufficient documentation

## 2024-06-11 ENCOUNTER — Other Ambulatory Visit (HOSPITAL_COMMUNITY): Payer: Self-pay | Admitting: Family Medicine

## 2024-06-11 DIAGNOSIS — Z1231 Encounter for screening mammogram for malignant neoplasm of breast: Secondary | ICD-10-CM

## 2024-08-04 ENCOUNTER — Ambulatory Visit (HOSPITAL_COMMUNITY)
Admission: RE | Admit: 2024-08-04 | Discharge: 2024-08-04 | Disposition: A | Discharge status: Home / Self Care | Source: Ambulatory Visit | Attending: Family Medicine | Admitting: Family Medicine

## 2024-08-04 DIAGNOSIS — Z1231 Encounter for screening mammogram for malignant neoplasm of breast: Secondary | ICD-10-CM | POA: Diagnosis present
